# Patient Record
Sex: Male | Born: 1945 | Race: White | Hispanic: No | Marital: Single | Smoking: Current some day smoker
Health system: Southern US, Community
[De-identification: ages and names within clinical notes are randomized; demographics above are authoritative.]

## PROBLEM LIST (undated history)

## (undated) DIAGNOSIS — N400 Enlarged prostate without lower urinary tract symptoms: Secondary | ICD-10-CM

---

## 1998-06-01 ENCOUNTER — Ambulatory Visit (HOSPITAL_COMMUNITY): Admission: RE | Admit: 1998-06-01 | Discharge: 1998-06-01 | Payer: Self-pay | Admitting: Gastroenterology

## 1998-06-22 ENCOUNTER — Ambulatory Visit (HOSPITAL_COMMUNITY): Admission: RE | Admit: 1998-06-22 | Discharge: 1998-06-22 | Payer: Self-pay | Admitting: Gastroenterology

## 1998-07-16 ENCOUNTER — Ambulatory Visit (HOSPITAL_COMMUNITY): Admission: RE | Admit: 1998-07-16 | Discharge: 1998-07-16 | Payer: Self-pay | Admitting: Gastroenterology

## 1998-07-23 ENCOUNTER — Ambulatory Visit (HOSPITAL_COMMUNITY): Admission: RE | Admit: 1998-07-23 | Discharge: 1998-07-23 | Payer: Self-pay | Admitting: Gastroenterology

## 1998-09-29 ENCOUNTER — Ambulatory Visit (HOSPITAL_COMMUNITY): Admission: RE | Admit: 1998-09-29 | Discharge: 1998-09-29 | Payer: Self-pay | Admitting: Gastroenterology

## 2018-04-02 DIAGNOSIS — Z01818 Encounter for other preprocedural examination: Secondary | ICD-10-CM

## 2018-04-16 ENCOUNTER — Other Ambulatory Visit: Payer: Self-pay

## 2018-04-16 ENCOUNTER — Emergency Department (HOSPITAL_COMMUNITY): Payer: Medicare Other

## 2018-04-16 ENCOUNTER — Inpatient Hospital Stay (HOSPITAL_COMMUNITY)
Admission: EM | Admit: 2018-04-16 | Discharge: 2018-05-05 | DRG: 963 | Disposition: E | Payer: Medicare Other | Attending: General Surgery | Admitting: General Surgery

## 2018-04-16 ENCOUNTER — Encounter (HOSPITAL_COMMUNITY): Payer: Self-pay

## 2018-04-16 DIAGNOSIS — S22029A Unspecified fracture of second thoracic vertebra, initial encounter for closed fracture: Secondary | ICD-10-CM | POA: Diagnosis present

## 2018-04-16 DIAGNOSIS — N400 Enlarged prostate without lower urinary tract symptoms: Secondary | ICD-10-CM | POA: Diagnosis present

## 2018-04-16 DIAGNOSIS — G8250 Quadriplegia, unspecified: Secondary | ICD-10-CM | POA: Diagnosis present

## 2018-04-16 DIAGNOSIS — S22059A Unspecified fracture of T5-T6 vertebra, initial encounter for closed fracture: Secondary | ICD-10-CM | POA: Diagnosis present

## 2018-04-16 DIAGNOSIS — S14102A Unspecified injury at C2 level of cervical spinal cord, initial encounter: Secondary | ICD-10-CM | POA: Diagnosis present

## 2018-04-16 DIAGNOSIS — Z515 Encounter for palliative care: Secondary | ICD-10-CM | POA: Diagnosis present

## 2018-04-16 DIAGNOSIS — J9601 Acute respiratory failure with hypoxia: Secondary | ICD-10-CM | POA: Diagnosis present

## 2018-04-16 DIAGNOSIS — Z23 Encounter for immunization: Secondary | ICD-10-CM | POA: Diagnosis present

## 2018-04-16 DIAGNOSIS — S36899A Unspecified injury of other intra-abdominal organs, initial encounter: Secondary | ICD-10-CM | POA: Diagnosis present

## 2018-04-16 DIAGNOSIS — S22019A Unspecified fracture of first thoracic vertebra, initial encounter for closed fracture: Secondary | ICD-10-CM | POA: Diagnosis present

## 2018-04-16 DIAGNOSIS — Z978 Presence of other specified devices: Secondary | ICD-10-CM

## 2018-04-16 DIAGNOSIS — F17200 Nicotine dependence, unspecified, uncomplicated: Secondary | ICD-10-CM | POA: Diagnosis present

## 2018-04-16 DIAGNOSIS — R001 Bradycardia, unspecified: Secondary | ICD-10-CM | POA: Diagnosis present

## 2018-04-16 DIAGNOSIS — S22039A Unspecified fracture of third thoracic vertebra, initial encounter for closed fracture: Secondary | ICD-10-CM | POA: Diagnosis present

## 2018-04-16 DIAGNOSIS — I9589 Other hypotension: Secondary | ICD-10-CM | POA: Diagnosis present

## 2018-04-16 DIAGNOSIS — Z66 Do not resuscitate: Secondary | ICD-10-CM | POA: Diagnosis not present

## 2018-04-16 DIAGNOSIS — J9690 Respiratory failure, unspecified, unspecified whether with hypoxia or hypercapnia: Secondary | ICD-10-CM

## 2018-04-16 DIAGNOSIS — M542 Cervicalgia: Secondary | ICD-10-CM | POA: Diagnosis present

## 2018-04-16 DIAGNOSIS — Y92410 Unspecified street and highway as the place of occurrence of the external cause: Secondary | ICD-10-CM

## 2018-04-16 DIAGNOSIS — S0101XA Laceration without foreign body of scalp, initial encounter: Secondary | ICD-10-CM | POA: Diagnosis present

## 2018-04-16 DIAGNOSIS — R578 Other shock: Secondary | ICD-10-CM | POA: Diagnosis not present

## 2018-04-16 DIAGNOSIS — S12201A Unspecified nondisplaced fracture of third cervical vertebra, initial encounter for closed fracture: Principal | ICD-10-CM | POA: Diagnosis present

## 2018-04-16 DIAGNOSIS — S14109A Unspecified injury at unspecified level of cervical spinal cord, initial encounter: Secondary | ICD-10-CM

## 2018-04-16 DIAGNOSIS — S22030A Wedge compression fracture of third thoracic vertebra, initial encounter for closed fracture: Secondary | ICD-10-CM

## 2018-04-16 HISTORY — DX: Benign prostatic hyperplasia without lower urinary tract symptoms: N40.0

## 2018-04-16 LAB — COMPREHENSIVE METABOLIC PANEL
ALT: 50 U/L — ABNORMAL HIGH (ref 0–44)
AST: 52 U/L — ABNORMAL HIGH (ref 15–41)
Albumin: 3 g/dL — ABNORMAL LOW (ref 3.5–5.0)
Alkaline Phosphatase: 57 U/L (ref 38–126)
Anion gap: 9 (ref 5–15)
BUN: 15 mg/dL (ref 8–23)
CO2: 20 mmol/L — ABNORMAL LOW (ref 22–32)
Calcium: 8.1 mg/dL — ABNORMAL LOW (ref 8.9–10.3)
Chloride: 111 mmol/L (ref 98–111)
Creatinine, Ser: 1.17 mg/dL (ref 0.61–1.24)
GFR calc Af Amer: >60 mL/min (ref >60)
GFR calc non Af Amer: >60 mL/min (ref >60)
Glucose, Bld: 102 mg/dL — ABNORMAL HIGH (ref 70–99)
Potassium: 3.6 mmol/L (ref 3.5–5.1)
Sodium: 140 mmol/L (ref 135–145)
Total Bilirubin: 0.6 mg/dL (ref 0.3–1.2)
Total Protein: 5.4 g/dL — ABNORMAL LOW (ref 6.5–8.1)

## 2018-04-16 LAB — BPAM RBC
Blood Product Expiration Date: 202003102359
Blood Product Expiration Date: 202003102359
ISSUE DATE / TIME: 202002112135
ISSUE DATE / TIME: 202002112135
Unit Type and Rh: 5100
Unit Type and Rh: 5100

## 2018-04-16 LAB — TYPE AND SCREEN
ABO/RH(D): O POS
Antibody Screen: NEGATIVE
UNIT DIVISION: 0
Unit division: 0

## 2018-04-16 LAB — CBC
HCT: 40.9 % (ref 39.0–52.0)
Hemoglobin: 13.2 g/dL (ref 13.0–17.0)
MCH: 31.1 pg (ref 26.0–34.0)
MCHC: 32.3 g/dL (ref 30.0–36.0)
MCV: 96.5 fL (ref 80.0–100.0)
Platelets: 214 10*3/uL (ref 150–400)
RBC: 4.24 MIL/uL (ref 4.22–5.81)
RDW: 13 % (ref 11.5–15.5)
WBC: 11.2 10*3/uL — ABNORMAL HIGH (ref 4.0–10.5)
nRBC: 0 % (ref 0.0–0.2)

## 2018-04-16 LAB — TRIGLYCERIDES: Triglycerides: 99 mg/dL (ref <150)

## 2018-04-16 LAB — ETHANOL: Alcohol, Ethyl (B): <10 mg/dL (ref <10)

## 2018-04-16 LAB — POCT I-STAT 7, (LYTES, BLD GAS, ICA,H+H)
ACID-BASE DEFICIT: 8 mmol/L — AB (ref 0.0–2.0)
Bicarbonate: 17.6 mmol/L — ABNORMAL LOW (ref 20.0–28.0)
Calcium, Ion: 1.03 mmol/L — ABNORMAL LOW (ref 1.15–1.40)
HEMATOCRIT: 34 % — AB (ref 39.0–52.0)
Hemoglobin: 11.6 g/dL — ABNORMAL LOW (ref 13.0–17.0)
O2 Saturation: 99 %
Patient temperature: 98.6
Potassium: 3.6 mmol/L (ref 3.5–5.1)
Sodium: 142 mmol/L (ref 135–145)
TCO2: 19 mmol/L — ABNORMAL LOW (ref 22–32)
pCO2 arterial: 34.2 mmHg (ref 32.0–48.0)
pH, Arterial: 7.32 — ABNORMAL LOW (ref 7.350–7.450)
pO2, Arterial: 146 mmHg — ABNORMAL HIGH (ref 83.0–108.0)

## 2018-04-16 LAB — LACTIC ACID, PLASMA: Lactic Acid, Venous: 1.2 mmol/L (ref 0.5–1.9)

## 2018-04-16 LAB — ABO/RH: ABO/RH(D): O POS

## 2018-04-16 LAB — PROTIME-INR
INR: 1.07
Prothrombin Time: 13.8 seconds (ref 11.4–15.2)

## 2018-04-16 LAB — I-STAT CREATININE, ED: Creatinine, Ser: 1.1 mg/dL (ref 0.61–1.24)

## 2018-04-16 LAB — SAMPLE TO BLOOD BANK

## 2018-04-16 LAB — CBG MONITORING, ED: Glucose-Capillary: 82 mg/dL (ref 70–99)

## 2018-04-16 LAB — CDS SEROLOGY

## 2018-04-16 MED ORDER — ENOXAPARIN SODIUM 40 MG/0.4ML ~~LOC~~ SOLN
40.0000 mg | SUBCUTANEOUS | Status: DC
  Administered 2018-04-17 – 2018-04-21 (×5): 40 mg via SUBCUTANEOUS
  Filled 2018-04-16 (×6): qty 0.4

## 2018-04-16 MED ORDER — LIDOCAINE HCL (PF) 1 % IJ SOLN: 30.0000 mL | Freq: Once | INTRAMUSCULAR | Status: DC

## 2018-04-16 MED ORDER — PROPOFOL 1000 MG/100ML IV EMUL
5.0000 ug/kg/min | INTRAVENOUS | Status: DC
  Administered 2018-04-17: 5 ug/kg/min via INTRAVENOUS
  Administered 2018-04-17 – 2018-04-18 (×3): 15 ug/kg/min via INTRAVENOUS
  Administered 2018-04-19: 10 ug/kg/min via INTRAVENOUS
  Administered 2018-04-19: 20 ug/kg/min via INTRAVENOUS
  Administered 2018-04-19: 15 ug/kg/min via INTRAVENOUS
  Administered 2018-04-20: 20 ug/kg/min via INTRAVENOUS
  Administered 2018-04-20: 10 ug/kg/min via INTRAVENOUS
  Administered 2018-04-22: 20 ug/kg/min via INTRAVENOUS
  Filled 2018-04-16 (×4): qty 100
  Filled 2018-04-16: qty 200
  Filled 2018-04-16: qty 100
  Filled 2018-04-16 (×2): qty 200
  Filled 2018-04-16 (×4): qty 100

## 2018-04-16 MED ORDER — DEXTROSE-NACL 5-0.9 % IV SOLN
INTRAVENOUS | Status: DC
  Administered 2018-04-16 – 2018-04-19 (×8): via INTRAVENOUS
  Administered 2018-04-20: 125 mL/h via INTRAVENOUS
  Administered 2018-04-20 – 2018-04-21 (×5): via INTRAVENOUS

## 2018-04-16 MED ORDER — TETANUS-DIPHTH-ACELL PERTUSSIS 5-2.5-18.5 LF-MCG/0.5 IM SUSP
0.5000 mL | Freq: Once | INTRAMUSCULAR | Status: AC
  Administered 2018-04-16: 0.5 mL via INTRAMUSCULAR
  Filled 2018-04-16: qty 0.5

## 2018-04-16 MED ORDER — ONDANSETRON HCL 4 MG/2ML IJ SOLN
INTRAMUSCULAR | Status: AC
  Filled 2018-04-16: qty 2

## 2018-04-16 MED ORDER — ETOMIDATE 2 MG/ML IV SOLN
INTRAVENOUS | Status: AC | PRN
  Administered 2018-04-16: 20 mg via INTRAVENOUS

## 2018-04-16 MED ORDER — FENTANYL CITRATE (PF) 100 MCG/2ML IJ SOLN: 50.0000 ug | Freq: Once | INTRAMUSCULAR | Status: DC

## 2018-04-16 MED ORDER — SODIUM CHLORIDE 0.9 % IV BOLUS: 1000.0000 mL | Freq: Once | INTRAVENOUS | Status: DC

## 2018-04-16 MED ORDER — PHENYLEPHRINE 40 MCG/ML (10ML) SYRINGE FOR IV PUSH (FOR BLOOD PRESSURE SUPPORT)
PREFILLED_SYRINGE | INTRAVENOUS | Status: AC
  Filled 2018-04-16: qty 10

## 2018-04-16 MED ORDER — PHENYLEPHRINE HCL 10 MG/ML IJ SOLN
INTRAMUSCULAR | Status: AC | PRN
  Administered 2018-04-16: 120 ug via INTRAVENOUS

## 2018-04-16 MED ORDER — IOHEXOL 300 MG/ML  SOLN
100.0000 mL | Freq: Once | INTRAMUSCULAR | Status: AC | PRN
  Administered 2018-04-16: 100 mL via INTRAVENOUS

## 2018-04-16 MED ORDER — ROCURONIUM BROMIDE 50 MG/5ML IV SOLN
INTRAVENOUS | Status: AC | PRN
  Administered 2018-04-16: 70 mg via INTRAVENOUS

## 2018-04-16 MED ORDER — INSULIN ASPART 100 UNIT/ML ~~LOC~~ SOLN: 0.0000 [IU] | Freq: Three times a day (TID) | SUBCUTANEOUS | Status: DC

## 2018-04-16 MED ORDER — FENTANYL 2500MCG IN NS 250ML (10MCG/ML) PREMIX INFUSION
0.0000 ug/h | INTRAVENOUS | Status: DC
  Administered 2018-04-16: 25 ug/h via INTRAVENOUS
  Administered 2018-04-17 – 2018-04-19 (×3): 150 ug/h via INTRAVENOUS
  Administered 2018-04-19: 125 ug/h via INTRAVENOUS
  Administered 2018-04-20 (×2): 150 ug/h via INTRAVENOUS
  Administered 2018-04-20: 200 ug/h via INTRAVENOUS
  Administered 2018-04-21: 225 ug/h via INTRAVENOUS
  Administered 2018-04-21: 200 ug/h via INTRAVENOUS
  Administered 2018-04-22: 250 ug/h via INTRAVENOUS
  Filled 2018-04-16 (×9): qty 250
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
## 2018-04-16 NOTE — ED Notes (Signed)
Upgraded to Lvl 1 for hypotension

## 2018-04-16 NOTE — Progress Notes (Signed)
   04/24/2018 2200  Clinical Encounter Type  Visited With Patient and family together  Visit Type ED;Critical Care  Provided the ministry of presence and comfort to family.

## 2018-04-17 ENCOUNTER — Inpatient Hospital Stay (HOSPITAL_COMMUNITY): Payer: Medicare Other

## 2018-04-17 LAB — CBC
HCT: 39.3 % (ref 39.0–52.0)
Hemoglobin: 11.9 g/dL — ABNORMAL LOW (ref 13.0–17.0)
MCH: 31.3 pg (ref 26.0–34.0)
MCHC: 30.3 g/dL (ref 30.0–36.0)
MCV: 103.4 fL — ABNORMAL HIGH (ref 80.0–100.0)
Platelets: 197 10*3/uL (ref 150–400)
RBC: 3.8 MIL/uL — ABNORMAL LOW (ref 4.22–5.81)
RDW: 13.1 % (ref 11.5–15.5)
WBC: 16.3 10*3/uL — ABNORMAL HIGH (ref 4.0–10.5)
nRBC: 0 % (ref 0.0–0.2)

## 2018-04-17 LAB — GLUCOSE, CAPILLARY
GLUCOSE-CAPILLARY: 170 mg/dL — AB (ref 70–99)
GLUCOSE-CAPILLARY: 90 mg/dL (ref 70–99)
Glucose-Capillary: 113 mg/dL — ABNORMAL HIGH (ref 70–99)
Glucose-Capillary: 96 mg/dL (ref 70–99)
Glucose-Capillary: 100 mg/dL — ABNORMAL HIGH (ref 70–99)
Glucose-Capillary: 117 mg/dL — ABNORMAL HIGH (ref 70–99)

## 2018-04-17 LAB — URINALYSIS, ROUTINE W REFLEX MICROSCOPIC
Bacteria, UA: NONE SEEN
Bilirubin Urine: NEGATIVE
Glucose, UA: NEGATIVE mg/dL
Ketones, ur: 5 mg/dL — AB
Nitrite: NEGATIVE
Protein, ur: NEGATIVE mg/dL
Specific Gravity, Urine: 1.016 (ref 1.005–1.030)
pH: 7 (ref 5.0–8.0)

## 2018-04-17 LAB — BASIC METABOLIC PANEL
Anion gap: 6 (ref 5–15)
BUN: 14 mg/dL (ref 8–23)
CO2: 20 mmol/L — ABNORMAL LOW (ref 22–32)
Calcium: 7 mg/dL — ABNORMAL LOW (ref 8.9–10.3)
Chloride: 115 mmol/L — ABNORMAL HIGH (ref 98–111)
Creatinine, Ser: 1.1 mg/dL (ref 0.61–1.24)
GFR calc Af Amer: >60 mL/min (ref >60)
GFR calc non Af Amer: >60 mL/min (ref >60)
Glucose, Bld: 173 mg/dL — ABNORMAL HIGH (ref 70–99)
Potassium: 3.9 mmol/L (ref 3.5–5.1)
Sodium: 141 mmol/L (ref 135–145)

## 2018-04-17 LAB — PREPARE FRESH FROZEN PLASMA: Unit division: 0

## 2018-04-17 LAB — BPAM FFP
BLOOD PRODUCT EXPIRATION DATE: 202002132359
Blood Product Expiration Date: 202002132359
ISSUE DATE / TIME: 202002112135
ISSUE DATE / TIME: 202002112135
Unit Type and Rh: 6200
Unit Type and Rh: 6200

## 2018-04-17 LAB — MRSA PCR SCREENING: MRSA by PCR: NEGATIVE

## 2018-04-17 LAB — PHOSPHORUS: Phosphorus: 2.1 mg/dL — ABNORMAL LOW (ref 2.5–4.6)

## 2018-04-17 LAB — MAGNESIUM: Magnesium: 1.6 mg/dL — ABNORMAL LOW (ref 1.7–2.4)

## 2018-04-17 LAB — BLOOD PRODUCT ORDER (VERBAL) VERIFICATION

## 2018-04-17 MED ORDER — ONDANSETRON HCL 4 MG/2ML IJ SOLN
INTRAMUSCULAR | Status: AC | PRN
  Administered 2018-04-16: 4 mg via INTRAVENOUS

## 2018-04-17 MED ORDER — IOPAMIDOL (ISOVUE-370) INJECTION 76%
INTRAVENOUS | Status: AC
  Administered 2018-04-17: 16:00:00
  Filled 2018-04-17: qty 100

## 2018-04-17 MED ORDER — CHLORHEXIDINE GLUCONATE 0.12% ORAL RINSE (MEDLINE KIT)
15.0000 mL | Freq: Two times a day (BID) | OROMUCOSAL | Status: DC
  Administered 2018-04-17 – 2018-04-22 (×11): 15 mL via OROMUCOSAL

## 2018-04-17 MED ORDER — INSULIN ASPART 100 UNIT/ML ~~LOC~~ SOLN
0.0000 [IU] | SUBCUTANEOUS | Status: DC
  Administered 2018-04-17: 3 [IU] via SUBCUTANEOUS
  Administered 2018-04-18 (×2): 2 [IU] via SUBCUTANEOUS
  Administered 2018-04-19: 3 [IU] via SUBCUTANEOUS

## 2018-04-17 MED ORDER — PANTOPRAZOLE SODIUM 40 MG IV SOLR
40.0000 mg | Freq: Every day | INTRAVENOUS | Status: DC
  Administered 2018-04-17 – 2018-04-21 (×5): 40 mg via INTRAVENOUS
  Filled 2018-04-17 (×5): qty 40

## 2018-04-17 MED ORDER — ACETAMINOPHEN 160 MG/5ML PO SOLN
650.0000 mg | ORAL | Status: DC | PRN
  Administered 2018-04-17 – 2018-04-21 (×4): 650 mg
  Filled 2018-04-17 (×4): qty 20.3

## 2018-04-17 MED ORDER — VITAL AF 1.2 CAL PO LIQD
1000.0000 mL | ORAL | Status: DC
  Administered 2018-04-17 – 2018-04-19 (×3): 1000 mL

## 2018-04-17 MED ORDER — ORAL CARE MOUTH RINSE
15.0000 mL | OROMUCOSAL | Status: DC
  Administered 2018-04-17 – 2018-04-22 (×52): 15 mL via OROMUCOSAL

## 2018-04-17 MED ORDER — PHENYLEPHRINE HCL-NACL 10-0.9 MG/250ML-% IV SOLN
0.0000 ug/min | INTRAVENOUS | Status: DC
  Administered 2018-04-17: 60 ug/min via INTRAVENOUS
  Administered 2018-04-17 (×2): 40 ug/min via INTRAVENOUS
  Administered 2018-04-17 (×2): 60 ug/min via INTRAVENOUS
  Administered 2018-04-17: 20 ug/min via INTRAVENOUS
  Administered 2018-04-17: 50 ug/min via INTRAVENOUS
  Administered 2018-04-18: 40 ug/min via INTRAVENOUS
  Administered 2018-04-18: 200 ug/min via INTRAVENOUS
  Administered 2018-04-18: 30 ug/min via INTRAVENOUS
  Administered 2018-04-18: 220 ug/min via INTRAVENOUS
  Administered 2018-04-18: 50 ug/min via INTRAVENOUS
  Administered 2018-04-18: 220 ug/min via INTRAVENOUS
  Administered 2018-04-18: 30 ug/min via INTRAVENOUS
  Administered 2018-04-18: 150 ug/min via INTRAVENOUS
  Administered 2018-04-19: 70 ug/min via INTRAVENOUS
  Administered 2018-04-19: 90 ug/min via INTRAVENOUS
  Administered 2018-04-19: 60 ug/min via INTRAVENOUS
  Administered 2018-04-19: 70 ug/min via INTRAVENOUS
  Administered 2018-04-19 (×2): 100 ug/min via INTRAVENOUS
  Administered 2018-04-19 (×2): 70 ug/min via INTRAVENOUS
  Administered 2018-04-19: 90 ug/min via INTRAVENOUS
  Administered 2018-04-19: 70 ug/min via INTRAVENOUS
  Administered 2018-04-20 (×2): 60 ug/min via INTRAVENOUS
  Administered 2018-04-20: 75 ug/min via INTRAVENOUS
  Administered 2018-04-20: 60 ug/min via INTRAVENOUS
  Administered 2018-04-20: 75 ug/min via INTRAVENOUS
  Administered 2018-04-20 (×2): 70 ug/min via INTRAVENOUS
  Administered 2018-04-20: 60 ug/min via INTRAVENOUS
  Administered 2018-04-20: 75 ug/min via INTRAVENOUS
  Administered 2018-04-21: 45 ug/min via INTRAVENOUS
  Administered 2018-04-21: 65 ug/min via INTRAVENOUS
  Administered 2018-04-21 (×3): 45 ug/min via INTRAVENOUS
  Administered 2018-04-22: 80 ug/min via INTRAVENOUS
  Administered 2018-04-22: 20 ug/min via INTRAVENOUS
  Filled 2018-04-17: qty 500
  Filled 2018-04-17 (×15): qty 250
  Filled 2018-04-17: qty 500
  Filled 2018-04-17 (×6): qty 250
  Filled 2018-04-17: qty 1000
  Filled 2018-04-17 (×4): qty 250
  Filled 2018-04-17: qty 500
  Filled 2018-04-17: qty 250
  Filled 2018-04-17: qty 500
  Filled 2018-04-17 (×4): qty 250
  Filled 2018-04-17: qty 500

## 2018-04-17 MED ORDER — SODIUM CHLORIDE 0.9 % IV SOLN
INTRAVENOUS | Status: AC | PRN
  Administered 2018-04-16: 1000 mL via INTRAVENOUS
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
## 2018-04-17 NOTE — Progress Notes (Signed)
Patient transported to 4N/016 with no complications.

## 2018-04-17 NOTE — Progress Notes (Signed)
MRI scan done earlier today was reviewed by me.  This demonstrates evidence of likely ligamentous injury at C2-3 level with probable hyperflexion injury.  Patient with severe high signal within the spinal cord at the C2-C4 levels.  This is consistent with severe spinal cord injury.  It is also consistent with the patient's examination.  There are no direct compressive lesions that are ongoing.  The patient's alignment right now is reasonably anatomic without evidence of significant bony stenosis.  There are no surgical interventions that will improve his situation.  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  Given his advanced age his long-term survivability is very low.  At this point I think it is probably worthwhile to get palliative care involved discussed situation further with the patient and his family with regard to expectations and limits of care.

## 2018-04-17 NOTE — Progress Notes (Signed)
RT transported pt to and from 4N16 to MRI on vent. Pt stable throughout with no complications. VS within normal limits. RT will continue to monitor

## 2018-04-17 NOTE — Progress Notes (Signed)
RT transported patient with RN from 4N16 to CT with no complications. Vital signs stable. RT will continue to monitor.

## 2018-04-17 NOTE — Progress Notes (Signed)
Patient remains hypertensive on pressors.  He is awake and aware.  He nods appropriately to questions.  He has no voluntary movement of his upper or lower extremities.  He is able to shrug his shoulders.  He has a sensory level approximately C5 bilaterally.  Patient has signs and symptoms of the complete upper cervical spinal cord injury.  MRI scanning is still pending.  Overall given the patient's advanced age is chance of recovery is quite poor.  Continue supportive care.
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
## 2018-04-17 NOTE — Progress Notes (Signed)
Pt transported back from MRI via vent w/ no apparent complications.  Unit RT received pt in ICU.

## 2018-04-18 ENCOUNTER — Inpatient Hospital Stay (HOSPITAL_COMMUNITY): Payer: Medicare Other

## 2018-04-18 LAB — CBC
HCT: 36.8 % — ABNORMAL LOW (ref 39.0–52.0)
Hemoglobin: 12.1 g/dL — ABNORMAL LOW (ref 13.0–17.0)
MCH: 32 pg (ref 26.0–34.0)
MCHC: 32.9 g/dL (ref 30.0–36.0)
MCV: 97.4 fL (ref 80.0–100.0)
Platelets: 179 10*3/uL (ref 150–400)
RBC: 3.78 MIL/uL — ABNORMAL LOW (ref 4.22–5.81)
RDW: 13.8 % (ref 11.5–15.5)
WBC: 14.8 10*3/uL — ABNORMAL HIGH (ref 4.0–10.5)
nRBC: 0 % (ref 0.0–0.2)

## 2018-04-18 LAB — COMPREHENSIVE METABOLIC PANEL
ALT: 42 U/L (ref 0–44)
AST: 33 U/L (ref 15–41)
Albumin: 2.3 g/dL — ABNORMAL LOW (ref 3.5–5.0)
Alkaline Phosphatase: 47 U/L (ref 38–126)
Anion gap: 5 (ref 5–15)
BUN: 10 mg/dL (ref 8–23)
CO2: 19 mmol/L — ABNORMAL LOW (ref 22–32)
CREATININE: 1.04 mg/dL (ref 0.61–1.24)
Calcium: 7.4 mg/dL — ABNORMAL LOW (ref 8.9–10.3)
Chloride: 116 mmol/L — ABNORMAL HIGH (ref 98–111)
GFR calc Af Amer: >60 mL/min (ref >60)
GFR calc non Af Amer: >60 mL/min (ref >60)
Glucose, Bld: 133 mg/dL — ABNORMAL HIGH (ref 70–99)
Potassium: 3.4 mmol/L — ABNORMAL LOW (ref 3.5–5.1)
Sodium: 140 mmol/L (ref 135–145)
Total Bilirubin: 0.9 mg/dL (ref 0.3–1.2)
Total Protein: 4.8 g/dL — ABNORMAL LOW (ref 6.5–8.1)

## 2018-04-18 LAB — GLUCOSE, CAPILLARY
Glucose-Capillary: 96 mg/dL (ref 70–99)
Glucose-Capillary: 137 mg/dL — ABNORMAL HIGH (ref 70–99)
Glucose-Capillary: 114 mg/dL — ABNORMAL HIGH (ref 70–99)
Glucose-Capillary: 120 mg/dL — ABNORMAL HIGH (ref 70–99)
Glucose-Capillary: 100 mg/dL — ABNORMAL HIGH (ref 70–99)
Glucose-Capillary: 136 mg/dL — ABNORMAL HIGH (ref 70–99)

## 2018-04-18 LAB — PHOSPHORUS
Phosphorus: 1.5 mg/dL — ABNORMAL LOW (ref 2.5–4.6)
Phosphorus: 2.4 mg/dL — ABNORMAL LOW (ref 2.5–4.6)

## 2018-04-18 LAB — MAGNESIUM
Magnesium: 1.6 mg/dL — ABNORMAL LOW (ref 1.7–2.4)
Magnesium: 1.8 mg/dL (ref 1.7–2.4)

## 2018-04-18 MED ORDER — POTASSIUM PHOSPHATES 15 MMOLE/5ML IV SOLN
40.0000 meq | Freq: Once | INTRAVENOUS | Status: AC
  Administered 2018-04-18: 40 meq via INTRAVENOUS
  Filled 2018-04-18: qty 9.09

## 2018-04-18 MED ORDER — MAGNESIUM SULFATE 2 GM/50ML IV SOLN
2.0000 g | Freq: Once | INTRAVENOUS | Status: AC
  Administered 2018-04-18: 2 g via INTRAVENOUS
  Filled 2018-04-18: qty 50

## 2018-04-18 MED ORDER — ASPIRIN 325 MG PO TABS
325.0000 mg | ORAL_TABLET | Freq: Every day | ORAL | Status: DC
  Administered 2018-04-18 – 2018-04-20 (×3): 325 mg
  Filled 2018-04-18 (×4): qty 1

## 2018-04-18 MED ORDER — ALBUMIN HUMAN 5 % IV SOLN
25.0000 g | Freq: Once | INTRAVENOUS | Status: AC
  Administered 2018-04-18: 25 g via INTRAVENOUS
  Filled 2018-04-18: qty 500
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
## 2018-04-18 NOTE — Progress Notes (Signed)
Patient ID: Aaron Gilbert, male   DOB: 22-Sep-1945, 73 y.o.   MRN: [id number] I met with his sister, brother-in-law, and sister-in-law at the bedside. I discussed his devastating spinal cord injury and ventilator dependence. They report he would not want to live on a machine for the rest of his life. They plan to do terminal extubation but want to spend some time with him first which is very reasonable. I discussed making him DNR for now and they agree with that.   Georganna Skeans, MD, MPH, FACS Trauma: [phone] General Surgery: [phone]

## 2018-04-18 NOTE — Progress Notes (Signed)
Remains intubated on ventilator.  Patient awake and aware.  Can communicate by nodding and shaking his head.  Still no voluntary movement of upper or lower extremities.  Still with sensory level around C5.  Patient with devastating upper cervical spinal cord injury.  Poor prognosis for recovery.  Poor prognosis for ability to maintain respiratory function without aid of ventilator.  Family unavailable for me to talk with yesterday.  Dr. Janee Morn to try to talk with family today.  I still think palliative care is probably our best option.
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
## 2018-04-19 ENCOUNTER — Inpatient Hospital Stay (HOSPITAL_COMMUNITY): Payer: Medicare Other

## 2018-04-19 LAB — CBC
HEMATOCRIT: 39.6 % (ref 39.0–52.0)
Hemoglobin: 13 g/dL (ref 13.0–17.0)
MCH: 31.7 pg (ref 26.0–34.0)
MCHC: 32.8 g/dL (ref 30.0–36.0)
MCV: 96.6 fL (ref 80.0–100.0)
Platelets: 215 10*3/uL (ref 150–400)
RBC: 4.1 MIL/uL — ABNORMAL LOW (ref 4.22–5.81)
RDW: 13.5 % (ref 11.5–15.5)
WBC: 10.7 10*3/uL — ABNORMAL HIGH (ref 4.0–10.5)
nRBC: 0 % (ref 0.0–0.2)

## 2018-04-19 LAB — BASIC METABOLIC PANEL
Anion gap: 3 — ABNORMAL LOW (ref 5–15)
BUN: 12 mg/dL (ref 8–23)
CO2: 18 mmol/L — AB (ref 22–32)
Calcium: 7.1 mg/dL — ABNORMAL LOW (ref 8.9–10.3)
Chloride: 118 mmol/L — ABNORMAL HIGH (ref 98–111)
Creatinine, Ser: 1.09 mg/dL (ref 0.61–1.24)
GFR calc Af Amer: >60 mL/min (ref >60)
GFR calc non Af Amer: >60 mL/min (ref >60)
GLUCOSE: 168 mg/dL — AB (ref 70–99)
Potassium: 3.7 mmol/L (ref 3.5–5.1)
Sodium: 139 mmol/L (ref 135–145)

## 2018-04-19 LAB — MAGNESIUM: Magnesium: 2.1 mg/dL (ref 1.7–2.4)

## 2018-04-19 LAB — GLUCOSE, CAPILLARY
GLUCOSE-CAPILLARY: 148 mg/dL — AB (ref 70–99)
Glucose-Capillary: 161 mg/dL — ABNORMAL HIGH (ref 70–99)
Glucose-Capillary: 116 mg/dL — ABNORMAL HIGH (ref 70–99)

## 2018-04-19 LAB — PHOSPHORUS: Phosphorus: 1.9 mg/dL — ABNORMAL LOW (ref 2.5–4.6)

## 2018-04-19 LAB — TRIGLYCERIDES: Triglycerides: 95 mg/dL (ref <150)

## 2018-04-19 NOTE — Progress Notes (Signed)
Spoke with sister and her husband about the bradycardia/hypotension this morning. Discussed with family options going forward. They are very clear of not wanting tracheostomy/aggressive care. After discussions the family seems tentative on a terminal extubation 2/17 and would like to proceed with non-escalation of care (not adding a second pressor) and keeping things "as-is". We will move forward with that plan.

## 2018-04-19 NOTE — Progress Notes (Signed)
I also spoken with the family.  They have confirmed to me that they wish no aggressive long-term therapy.  They are looking toward terminal extubation in a couple days.  No new recommendations from my point.
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
## 2018-04-20 LAB — GLUCOSE, CAPILLARY
GLUCOSE-CAPILLARY: 127 mg/dL — AB (ref 70–99)
Glucose-Capillary: 119 mg/dL — ABNORMAL HIGH (ref 70–99)
Glucose-Capillary: 106 mg/dL — ABNORMAL HIGH (ref 70–99)
Glucose-Capillary: 132 mg/dL — ABNORMAL HIGH (ref 70–99)
Glucose-Capillary: 107 mg/dL — ABNORMAL HIGH (ref 70–99)
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
## 2018-04-21 LAB — GLUCOSE, CAPILLARY
Glucose-Capillary: 111 mg/dL — ABNORMAL HIGH (ref 70–99)
Glucose-Capillary: 102 mg/dL — ABNORMAL HIGH (ref 70–99)
Glucose-Capillary: 129 mg/dL — ABNORMAL HIGH (ref 70–99)
Glucose-Capillary: 122 mg/dL — ABNORMAL HIGH (ref 70–99)
Glucose-Capillary: 117 mg/dL — ABNORMAL HIGH (ref 70–99)
Glucose-Capillary: 105 mg/dL — ABNORMAL HIGH (ref 70–99)

## 2018-04-21 LAB — BASIC METABOLIC PANEL
Anion gap: 5 (ref 5–15)
BUN: 10 mg/dL (ref 8–23)
CO2: 19 mmol/L — ABNORMAL LOW (ref 22–32)
Calcium: 7.5 mg/dL — ABNORMAL LOW (ref 8.9–10.3)
Chloride: 117 mmol/L — ABNORMAL HIGH (ref 98–111)
Creatinine, Ser: 0.85 mg/dL (ref 0.61–1.24)
GFR calc Af Amer: >60 mL/min (ref >60)
GFR calc non Af Amer: >60 mL/min (ref >60)
Glucose, Bld: 135 mg/dL — ABNORMAL HIGH (ref 70–99)
Potassium: 3.8 mmol/L (ref 3.5–5.1)
Sodium: 141 mmol/L (ref 135–145)

## 2018-04-21 LAB — CBC WITH DIFFERENTIAL/PLATELET
Abs Immature Granulocytes: 0.09 10*3/uL — ABNORMAL HIGH (ref 0.00–0.07)
Basophils Absolute: 0 10*3/uL (ref 0.0–0.1)
Basophils Relative: 0 %
Eosinophils Absolute: 0.2 10*3/uL (ref 0.0–0.5)
Eosinophils Relative: 2 %
HEMATOCRIT: 33 % — AB (ref 39.0–52.0)
Hemoglobin: 10.8 g/dL — ABNORMAL LOW (ref 13.0–17.0)
Immature Granulocytes: 1 %
LYMPHS ABS: 1 10*3/uL (ref 0.7–4.0)
Lymphocytes Relative: 9 %
MCH: 32.1 pg (ref 26.0–34.0)
MCHC: 32.7 g/dL (ref 30.0–36.0)
MCV: 98.2 fL (ref 80.0–100.0)
Monocytes Absolute: 1.2 10*3/uL — ABNORMAL HIGH (ref 0.1–1.0)
Monocytes Relative: 11 %
Neutro Abs: 8.1 10*3/uL — ABNORMAL HIGH (ref 1.7–7.7)
Neutrophils Relative %: 77 %
Platelets: 186 10*3/uL (ref 150–400)
RBC: 3.36 MIL/uL — ABNORMAL LOW (ref 4.22–5.81)
RDW: 14 % (ref 11.5–15.5)
WBC: 10.6 10*3/uL — ABNORMAL HIGH (ref 4.0–10.5)
nRBC: 0 % (ref 0.0–0.2)
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
## 2018-04-22 LAB — GLUCOSE, CAPILLARY
Glucose-Capillary: 90 mg/dL (ref 70–99)
Glucose-Capillary: 119 mg/dL — ABNORMAL HIGH (ref 70–99)

## 2018-04-22 MED ORDER — GLYCOPYRROLATE 1 MG PO TABS
1.0000 mg | ORAL_TABLET | ORAL | Status: DC | PRN
  Filled 2018-04-22: qty 1

## 2018-04-22 MED ORDER — ACETAMINOPHEN 325 MG PO TABS: 650.0000 mg | ORAL_TABLET | Freq: Four times a day (QID) | ORAL | Status: DC | PRN

## 2018-04-22 MED ORDER — DEXTROSE 5 % IV SOLN: INTRAVENOUS | Status: DC

## 2018-04-22 MED ORDER — HALOPERIDOL LACTATE 5 MG/ML IJ SOLN: 2.5000 mg | INTRAMUSCULAR | Status: DC | PRN

## 2018-04-22 MED ORDER — GLYCOPYRROLATE 0.2 MG/ML IJ SOLN: 0.2000 mg | INTRAMUSCULAR | Status: DC | PRN

## 2018-04-22 MED ORDER — DIPHENHYDRAMINE HCL 50 MG/ML IJ SOLN: 25.0000 mg | INTRAMUSCULAR | Status: DC | PRN

## 2018-04-22 MED ORDER — MORPHINE BOLUS VIA INFUSION
5.0000 mg | INTRAVENOUS | Status: DC | PRN
  Filled 2018-04-22: qty 5

## 2018-04-22 MED ORDER — LORAZEPAM 2 MG/ML IJ SOLN
2.0000 mg | INTRAMUSCULAR | Status: DC | PRN
  Administered 2018-04-22: 4 mg via INTRAVENOUS
  Filled 2018-04-22: qty 2

## 2018-04-22 MED ORDER — POLYVINYL ALCOHOL 1.4 % OP SOLN
1.0000 [drp] | Freq: Four times a day (QID) | OPHTHALMIC | Status: DC | PRN
  Filled 2018-04-22: qty 15

## 2018-04-22 MED ORDER — ACETAMINOPHEN 650 MG RE SUPP: 650.0000 mg | Freq: Four times a day (QID) | RECTAL | Status: DC | PRN

## 2018-04-22 MED ORDER — MORPHINE SULFATE (PF) 2 MG/ML IV SOLN: 2.0000 mg | INTRAVENOUS | Status: DC | PRN

## 2018-04-22 MED ORDER — MORPHINE 100MG IN NS 100ML (1MG/ML) PREMIX INFUSION
0.0000 mg/h | INTRAVENOUS | Status: DC
  Administered 2018-04-22: 5 mg/h via INTRAVENOUS

## 2018-05-05 NOTE — Progress Notes (Signed)
Pt extubated using withdrawal guidelines.  Pt made comfortable.  RN @ bedside.
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
## 2018-05-05 NOTE — Progress Notes (Signed)
Pt terminally extubated at 1120am.  Morphine gtt started prior to extubation and 4mg  ativan given to pt.  Pt resting comfortably.  Support provided to family.  CDS notified. (Ref [id number], spoke with [facility]).

## 2018-05-05 NOTE — Progress Notes (Signed)
Nutrition Brief Note  Chart reviewed. Pt now transitioning to comfort care.  No further nutrition interventions warranted at this time.  Please re-consult as needed.   Niambi Smoak RD, LDN, CNSC [phone] Pager [phone] After Hours Pager    

## 2018-05-05 NOTE — Progress Notes (Signed)
Responded to PIV consult. RN present. Discussed possible need for PICC line due to multiple medications and potential for long-term stay pending decission on treatment plan per MD/family.
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
## 2018-05-05 NOTE — Progress Notes (Signed)
Wasted 60cc of Morphine gtt with Henderson Baltimore, RN and 90cc of fentanyl gtt.
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
## 2018-05-05 NOTE — Progress Notes (Signed)
Patient ID: ADI DORO, male   DOB: 1945-08-10, 73 y.o.   MRN: [id number] I met with his sister and several family members and friends. They want to proceed with terminal extubation after their preacher comes. Will place orders.  Georganna Skeans, MD, MPH, FACS Trauma: [phone] General Surgery: [phone]

## 2018-05-05 DEATH — deceased
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
